# Patient Record
Sex: Female | Born: 1955 | Race: Black or African American | Hispanic: No | Marital: Single | Smoking: Current every day smoker
Health system: Southern US, Community
[De-identification: ages and names within clinical notes are randomized; demographics above are authoritative.]

---

## 2013-01-25 ENCOUNTER — Emergency Department (HOSPITAL_BASED_OUTPATIENT_CLINIC_OR_DEPARTMENT_OTHER)
Admission: EM | Admit: 2013-01-25 | Discharge: 2013-01-25 | Disposition: A | Discharge status: Home / Self Care | Payer: No Typology Code available for payment source | Attending: Emergency Medicine | Admitting: Emergency Medicine

## 2013-01-25 ENCOUNTER — Encounter (HOSPITAL_BASED_OUTPATIENT_CLINIC_OR_DEPARTMENT_OTHER): Payer: Self-pay | Admitting: *Deleted

## 2013-01-25 DIAGNOSIS — M62838 Other muscle spasm: Secondary | ICD-10-CM | POA: Insufficient documentation

## 2013-01-25 DIAGNOSIS — Y939 Activity, unspecified: Secondary | ICD-10-CM | POA: Insufficient documentation

## 2013-01-25 DIAGNOSIS — Y92410 Unspecified street and highway as the place of occurrence of the external cause: Secondary | ICD-10-CM | POA: Insufficient documentation

## 2013-01-25 DIAGNOSIS — IMO0002 Reserved for concepts with insufficient information to code with codable children: Secondary | ICD-10-CM | POA: Insufficient documentation

## 2013-01-25 DIAGNOSIS — S0990XA Unspecified injury of head, initial encounter: Secondary | ICD-10-CM | POA: Insufficient documentation

## 2013-01-25 DIAGNOSIS — R209 Unspecified disturbances of skin sensation: Secondary | ICD-10-CM | POA: Insufficient documentation

## 2013-01-25 DIAGNOSIS — S0993XA Unspecified injury of face, initial encounter: Secondary | ICD-10-CM | POA: Insufficient documentation

## 2013-01-25 DIAGNOSIS — F17200 Nicotine dependence, unspecified, uncomplicated: Secondary | ICD-10-CM | POA: Insufficient documentation

## 2013-01-25 DIAGNOSIS — R062 Wheezing: Secondary | ICD-10-CM | POA: Insufficient documentation

## 2013-01-25 DIAGNOSIS — R42 Dizziness and giddiness: Secondary | ICD-10-CM | POA: Insufficient documentation

## 2013-01-25 MED ORDER — NAPROXEN 500 MG PO TABS: 500.0000 mg | ORAL_TABLET | Freq: Two times a day (BID) | ORAL | Status: DC

## 2013-01-25 MED ORDER — CYCLOBENZAPRINE HCL 10 MG PO TABS: 10.0000 mg | ORAL_TABLET | Freq: Two times a day (BID) | ORAL | Status: DC | PRN
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
## 2013-01-25 NOTE — ED Provider Notes (Signed)
Medical screening examination/treatment/procedure(s) were performed by non-physician practitioner and as supervising physician I was immediately available for consultation/collaboration.   Charles B. Bernette Mayers, MD 01/25/13 [phone]

## 2013-02-02 ENCOUNTER — Ambulatory Visit: Payer: Self-pay | Admitting: Family Medicine

## 2019-08-09 ENCOUNTER — Emergency Department (HOSPITAL_BASED_OUTPATIENT_CLINIC_OR_DEPARTMENT_OTHER)
Admission: EM | Admit: 2019-08-09 | Discharge: 2019-08-09 | Disposition: A | Discharge status: Home / Self Care | Payer: Self-pay | Attending: Emergency Medicine | Admitting: Emergency Medicine

## 2019-08-09 ENCOUNTER — Emergency Department (HOSPITAL_BASED_OUTPATIENT_CLINIC_OR_DEPARTMENT_OTHER): Payer: Self-pay

## 2019-08-09 ENCOUNTER — Encounter (HOSPITAL_BASED_OUTPATIENT_CLINIC_OR_DEPARTMENT_OTHER): Payer: Self-pay | Admitting: Emergency Medicine

## 2019-08-09 ENCOUNTER — Other Ambulatory Visit: Payer: Self-pay

## 2019-08-09 DIAGNOSIS — M25511 Pain in right shoulder: Secondary | ICD-10-CM | POA: Insufficient documentation

## 2019-08-09 DIAGNOSIS — F17210 Nicotine dependence, cigarettes, uncomplicated: Secondary | ICD-10-CM | POA: Insufficient documentation

## 2019-08-09 DIAGNOSIS — M25512 Pain in left shoulder: Secondary | ICD-10-CM | POA: Insufficient documentation

## 2019-08-09 DIAGNOSIS — R52 Pain, unspecified: Secondary | ICD-10-CM

## 2019-08-09 MED ORDER — NAPROXEN 500 MG PO TABS: 500.0000 mg | ORAL_TABLET | Freq: Two times a day (BID) | ORAL | 0 refills | Status: AC

## 2019-08-09 NOTE — Discharge Instructions (Addendum)
You were seen in the emergency department for bilateral shoulder pain.  Your x-rays show some degenerative changes in your shoulder on the left.  There is likely some element of rotator cuff injury and inflammation in your shoulders.  We are prescribing you an anti-inflammatory medication to help with the inflammation and pain.  You can try ice or heat to the area as this may also help.  We are giving you the number for a sports medicine doctor to arrange follow-up.  Please return if any worsening symptoms.

## 2019-08-09 NOTE — ED Provider Notes (Signed)
[facility] EMERGENCY DEPARTMENT Provider Note   CSN: [id number] Arrival date & time: 08/09/19  1204     History   Chief Complaint Chief Complaint  Patient presents with   Shoulder Pain    HPI Knox SalivaFrancesca Turner is a 63 y.o. female.  63 year old female with no primary care doctor complaining of left shoulder pain for months and right shoulder pain for 3 weeks.  She works at [employer] lifting a lot and she thinks it is an aggravation of current chronic activity.  No known trauma.  Not associate with any numbness or weakness.  She is tried some over-the-counter medications without any improvement.  She also said today while she was getting screened to go into [employer] she had a tickle in her throat and was coughing but that seems improved now.  No fevers or chills no headache body aches nausea vomiting diarrhea.     The history is provided by the patient.  Shoulder Pain Location:  Shoulder Shoulder location:  L shoulder and R shoulder Injury: no   Pain details:    Quality:  Sharp   Radiates to:  Does not radiate   Severity:  Moderate   Onset quality:  Gradual   Timing:  Intermittent   Progression:  Unchanged Handedness:  Right-handed Dislocation: no   Relieved by:  Nothing Worsened by:  Movement Ineffective treatments:  Acetaminophen Associated symptoms: no back pain, no fever and no neck pain     History reviewed. No pertinent past medical history.  There are no active problems to display for this patient.   History reviewed. No pertinent surgical history.   OB History   No obstetric history on file.      Home Medications    Prior to Admission medications   Medication Sig Start Date End Date Taking? Authorizing Provider  naproxen (NAPROSYN) 500 MG tablet Take 1 tablet (500 mg total) by mouth 2 (two) times daily. 08/09/19   Terrilee FilesButler, Mehar Sagen C, MD    Family History History reviewed. No pertinent family history.  Social History Social History   Tobacco  Use   Smoking status: Current Every Day Smoker  Substance Use Topics   Alcohol use: Yes   Drug use: No     Allergies   Penicillins   Review of Systems Review of Systems  Constitutional: Negative for fever.  HENT: Negative for sore throat.   Eyes: Negative for visual disturbance.  Respiratory: Positive for cough. Negative for shortness of breath.   Cardiovascular: Negative for chest pain.  Gastrointestinal: Negative for abdominal pain.  Musculoskeletal: Negative for back pain and neck pain.  Neurological: Negative for headaches.     Physical Exam Updated Vital Signs BP 90/60 (BP Location: Right Arm)    Pulse 79    Temp 98.3 F (36.8 C) (Oral)    Resp 16    SpO2 96%   Physical Exam Constitutional:      Appearance: She is well-developed.  HENT:     Head: Normocephalic and atraumatic.  Eyes:     Conjunctiva/sclera: Conjunctivae normal.  Neck:     Musculoskeletal: Neck supple.  Cardiovascular:     Rate and Rhythm: Normal rate and regular rhythm.     Pulses: Normal pulses.  Pulmonary:     Effort: Pulmonary effort is normal.     Breath sounds: No wheezing or rhonchi.  Musculoskeletal: Normal range of motion.        General: Tenderness present.     Comments: She has tenderness  in her anterior shoulders more left than right.  She has positive impingement sign on the left.  Normal passive range of motion but pain with range of motion active.  Normal landmarks.  Elbow wrist nontender.  Distal neurovascular intact.  Skin:    General: Skin is warm and dry.  Neurological:     Mental Status: She is alert.     GCS: GCS eye subscore is 4. GCS verbal subscore is 5. GCS motor subscore is 6.      ED Treatments / Results  Labs (all labs ordered are listed, but only abnormal results are displayed) Labs Reviewed - No data to display  EKG None  Radiology Dg Shoulder Right  Result Date: 08/09/2019 CLINICAL DATA:  Three weeks of right shoulder pain.  No injury. EXAM:  RIGHT SHOULDER - 2+ VIEW COMPARISON:  None. FINDINGS: There is no evidence of fracture or dislocation. There is no evidence of arthropathy or other focal bone abnormality. Soft tissues are unremarkable. IMPRESSION: Negative. Electronically Signed   By: Marin Olp M.D.   On: 08/09/2019 13:40   Dg Shoulder Left  Result Date: 08/09/2019 CLINICAL DATA:  Bilateral shoulder pain with left shoulder pain over the past 3 weeks. No direct injury. EXAM: LEFT SHOULDER - 2+ VIEW COMPARISON:  None. FINDINGS: Mild degenerate change of the [facility] joint and glenohumeral joints. No acute fracture or dislocation. IMPRESSION: No acute findings. 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 Electronically Signed   By: Marin Olp M.D.   On: 08/09/2019 13:41    Procedures Procedures (including critical care time)  Medications Ordered in ED Medications - No data to display   Initial Impression / Assessment and Plan / ED Course  I have reviewed the triage vital signs and the nursing notes.  Pertinent labs & imaging results that were available during my care of the patient were reviewed by me and considered in my medical decision making (see chart for details).       63 year old female here with bilateral shoulder pain left greater than right for what sounds like at least weeks but probably months.  Her x-rays show some degenerative changes but otherwise no fracture or dislocation.  We will trial her on NSAIDs and refer him to sports medicine for possible therapy versus injection.  She also has a cough which is not occurring now and her sats are 96% with a normal exam.  She declines Covid testing at this time.  Final Clinical Impressions(s) / ED Diagnoses   Final diagnoses:  Acute pain of both shoulders    ED Discharge Orders         Ordered    naproxen (NAPROSYN) 500 MG tablet  2 times daily     08/09/19 1410           Hayden Rasmussen, MD 08/09/19 1656

## 2019-08-09 NOTE — ED Notes (Signed)
Entered room to discharge patient and pt was not present. No belongings noted in the room. Pt suspected to leave prior to receiving printed AVS.

## 2019-08-09 NOTE — ED Triage Notes (Signed)
Pt states she has had right shoulder pain x 3 weeks

## 2020-04-13 IMAGING — CR DG SHOULDER 2+V*R*
3 series · 3 of 3 positions shown · non-contrast
Comparison: None.

CLINICAL DATA: Three weeks of right shoulder pain.  No injury.

EXAM:
RIGHT SHOULDER - 2+ VIEW

[w shoulder grashey right]
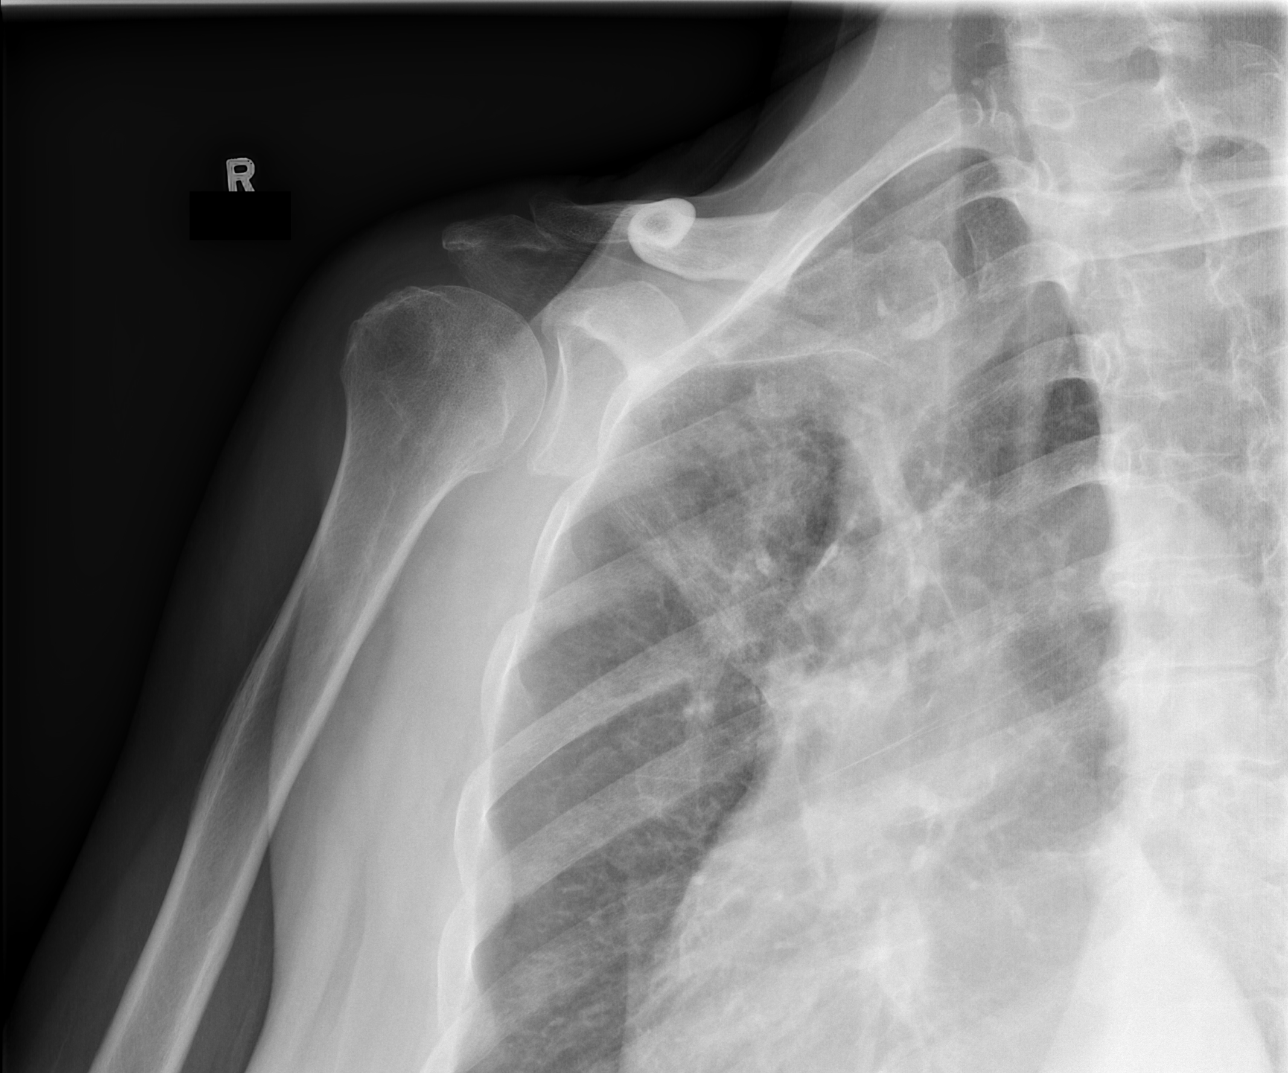

[w shoulder y view right]
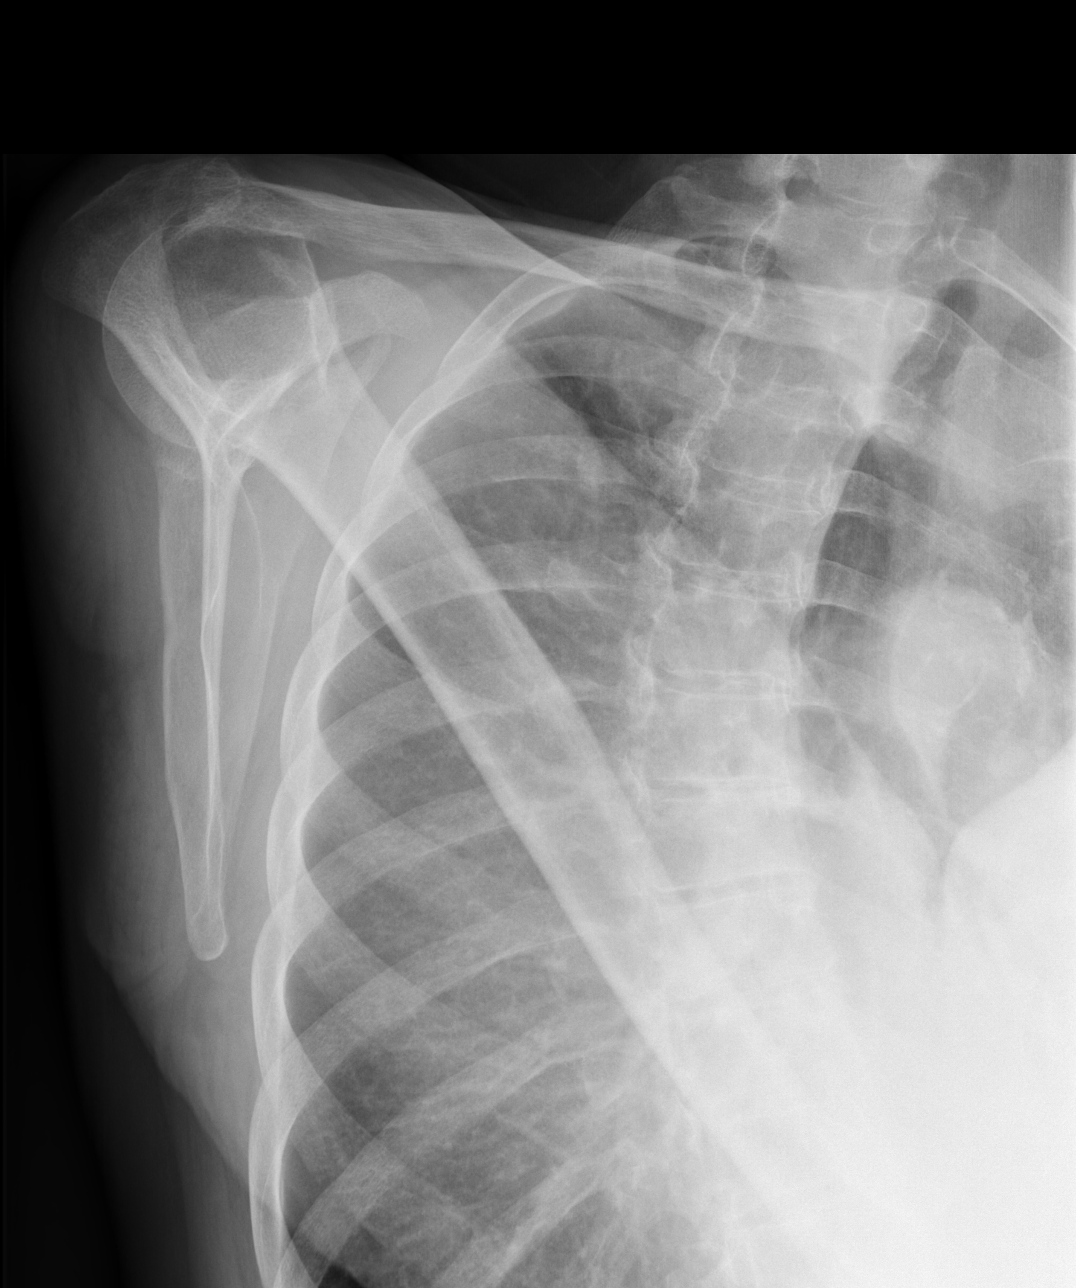

[x shoulder axillary right]
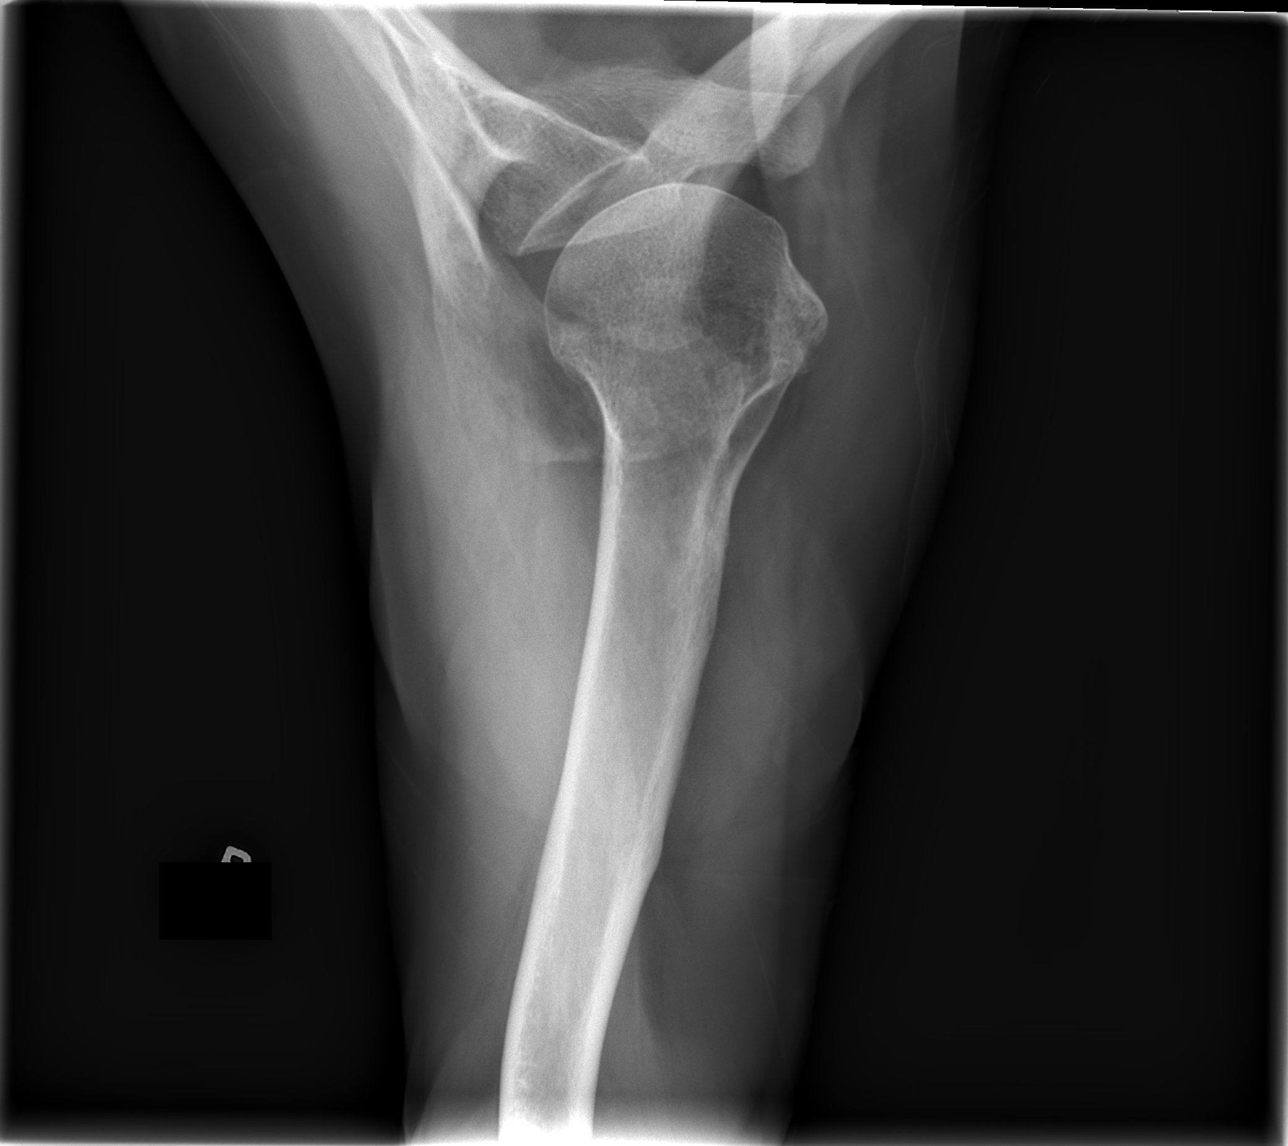

[3 of 3 positions shown; findings below may reference images not displayed]

FINDINGS: There is no evidence of fracture or dislocation. There is no
evidence of arthropathy or other focal bone abnormality. Soft
tissues are unremarkable.
IMPRESSION: Negative.

## 2020-04-13 IMAGING — CR DG SHOULDER 2+V*L*
3 series · 3 of 3 positions shown · non-contrast
Comparison: None.

CLINICAL DATA: Bilateral shoulder pain with left shoulder pain over
the past 3 weeks. No direct injury.

EXAM:
LEFT SHOULDER - 2+ VIEW

[w shoulder grashey left]
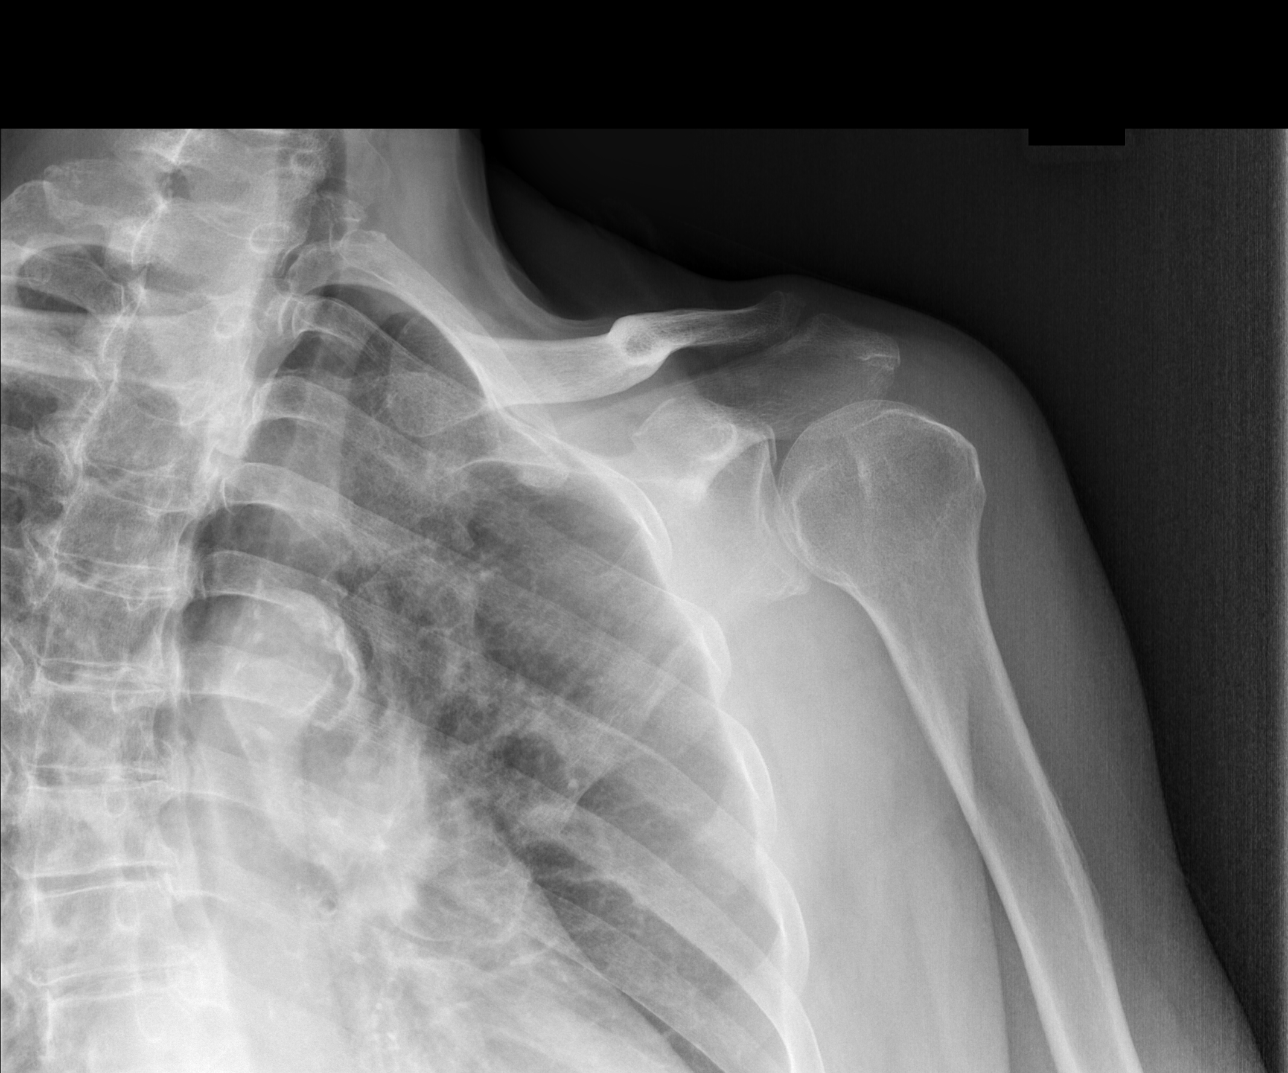

[w shoulder y view left]
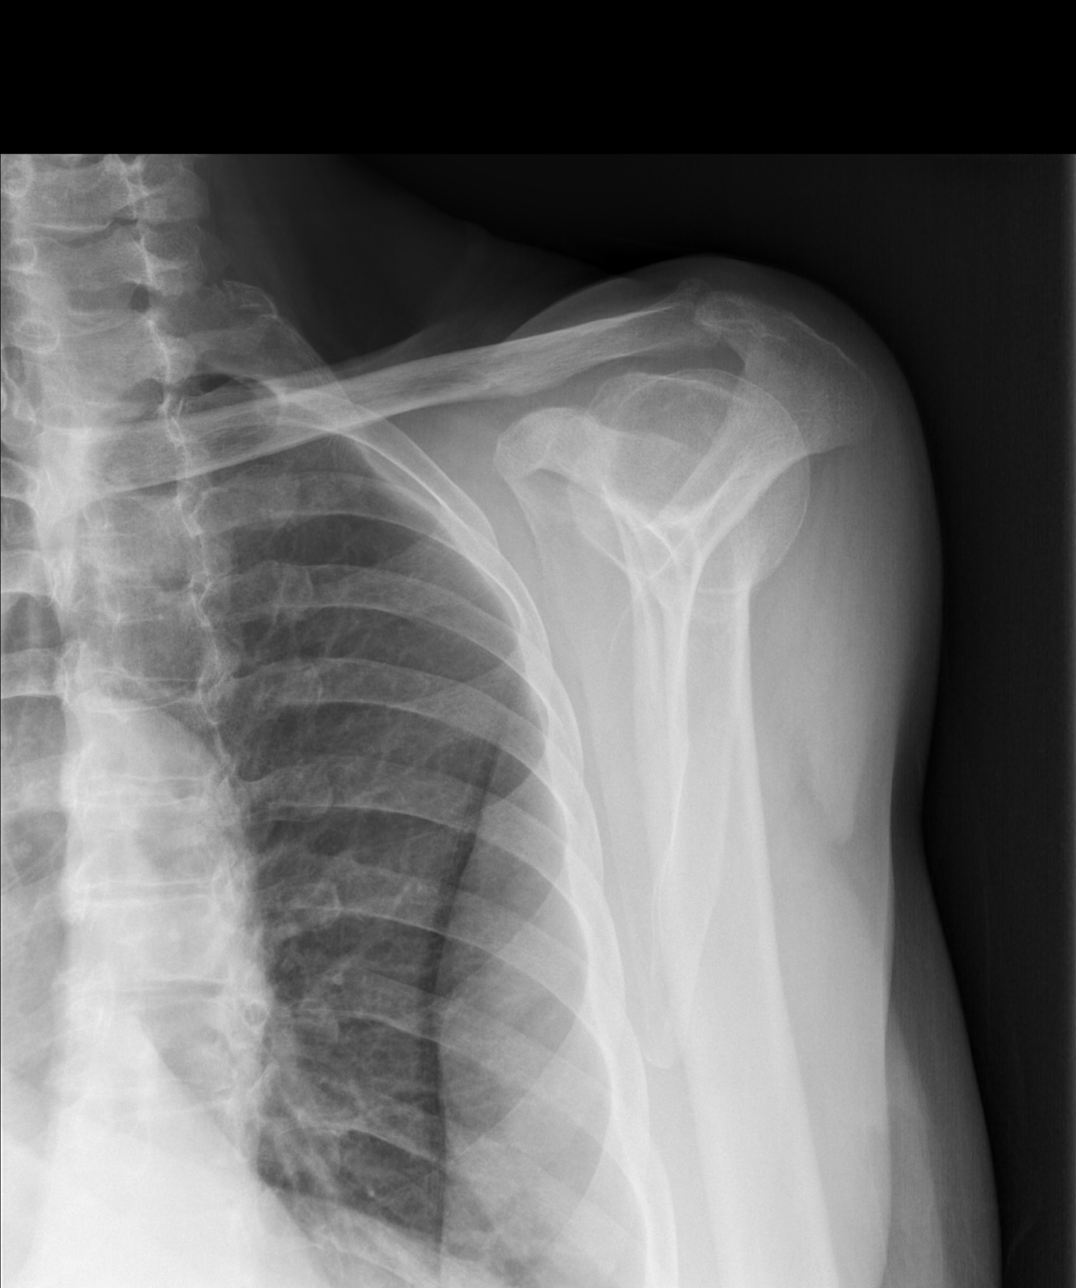

[x shoulder axillary left]
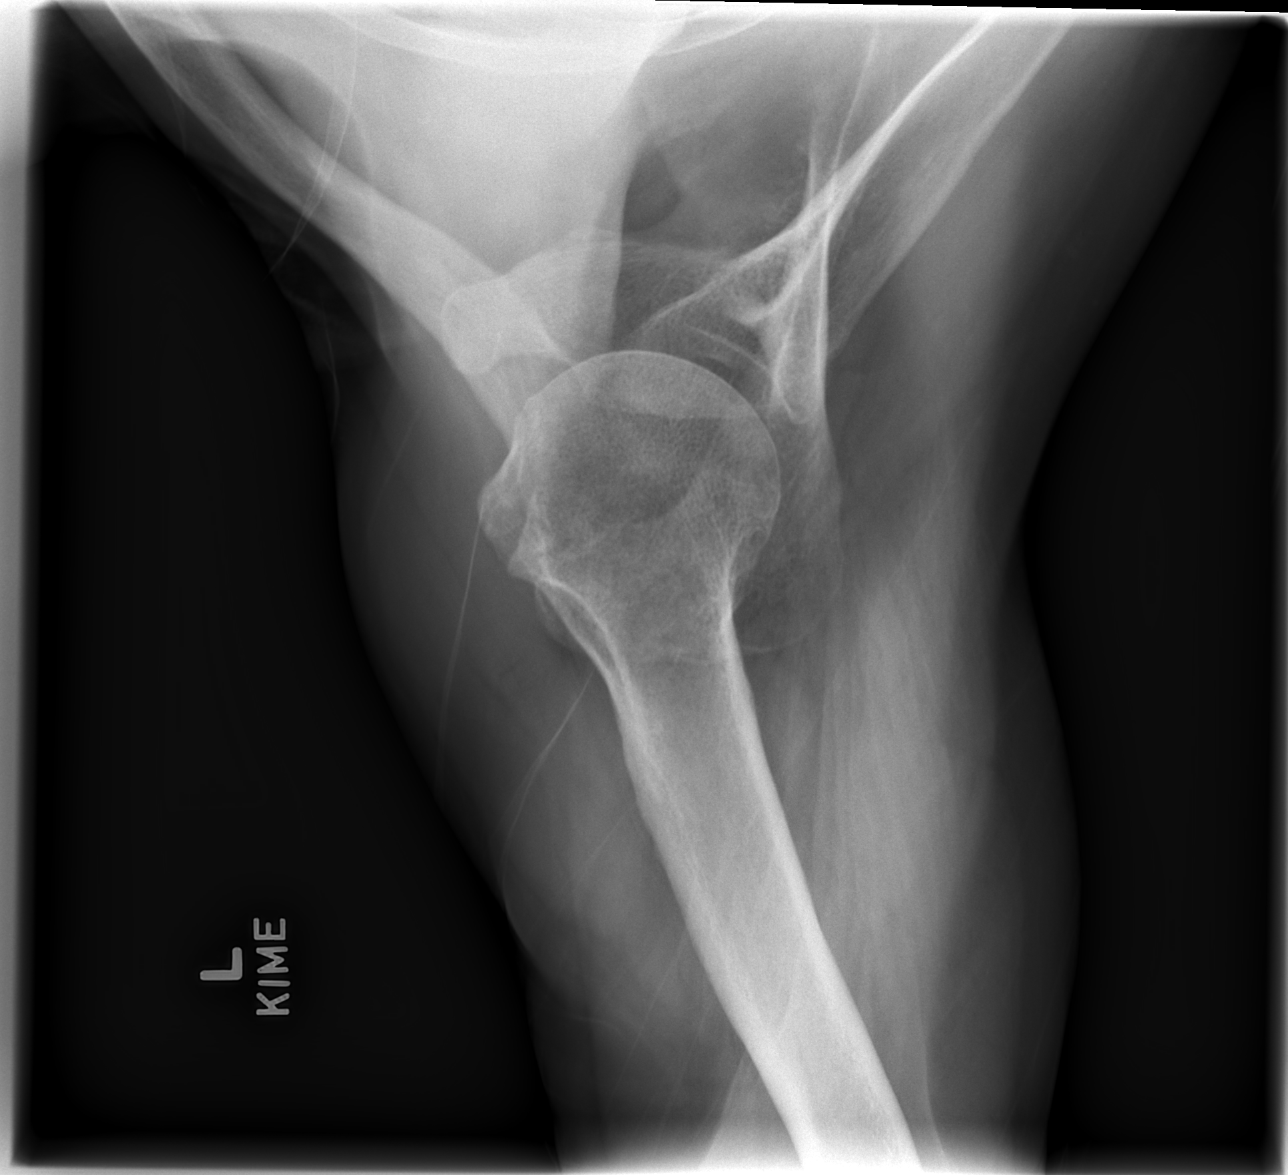

[3 of 3 positions shown; findings below may reference images not displayed]

FINDINGS: Mild degenerate change of the AC joint and glenohumeral joints. No
acute fracture or dislocation.
IMPRESSION: No acute findings.

Mild degenerative changes of the left shoulder.
